# Patient Record
Sex: Female | Born: 2000 | Race: White | Hispanic: No | Marital: Single
Health system: Southern US, Community
[De-identification: ages and names within clinical notes are randomized; demographics above are authoritative.]

---
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
## 2024-04-24 ENCOUNTER — Ambulatory Visit: Admitting: Obstetrics and Gynecology
